# Patient Record
Sex: Female | Born: 1990 | Race: White | Hispanic: No | Marital: Single | Smoking: Never smoker
Health system: Southern US, Community
[De-identification: ages and names within clinical notes are randomized; demographics above are authoritative.]

## PROBLEM LIST (undated history)

## (undated) HISTORY — PX: TONSILECTOMY/ADENOIDECTOMY WITH MYRINGOTOMY: SHX6125

---
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
## 2013-07-11 ENCOUNTER — Ambulatory Visit (INDEPENDENT_AMBULATORY_CARE_PROVIDER_SITE_OTHER): Payer: Medicaid Other | Admitting: General Practice

## 2013-07-11 ENCOUNTER — Encounter (INDEPENDENT_AMBULATORY_CARE_PROVIDER_SITE_OTHER): Payer: Self-pay

## 2013-07-11 ENCOUNTER — Encounter: Payer: Self-pay | Admitting: General Practice

## 2013-07-11 VITALS — BP 112/66 | HR 73 | Temp 97.7°F | Ht 66.0 in | Wt 161.0 lb

## 2013-07-11 DIAGNOSIS — N390 Urinary tract infection, site not specified: Secondary | ICD-10-CM

## 2013-07-11 DIAGNOSIS — R350 Frequency of micturition: Secondary | ICD-10-CM

## 2013-07-11 LAB — POCT URINALYSIS DIPSTICK
Ketones, UA: NEGATIVE
Nitrite, UA: NEGATIVE

## 2013-07-11 MED ORDER — PHENAZOPYRIDINE HCL 200 MG PO TABS: 200.0000 mg | ORAL_TABLET | Freq: Three times a day (TID) | ORAL | Status: DC | PRN

## 2013-07-11 MED ORDER — CIPROFLOXACIN HCL 500 MG PO TABS: 500.0000 mg | ORAL_TABLET | Freq: Two times a day (BID) | ORAL | Status: DC

## 2013-07-11 NOTE — Progress Notes (Signed)
  Subjective:    Patient ID: Sherry Bryant, female    DOB: 08-07-1991, 22 y.o.   MRN: [id number]  Urinary Tract Infection  This is a new problem. The current episode started in the past 7 days. The problem occurs every urination. The problem has been gradually worsening. The quality of the pain is described as aching. The pain is at a severity of 7/10. There has been no fever. She is sexually active. There is no history of pyelonephritis. Associated symptoms include flank pain, frequency and urgency. Pertinent negatives include no chills, hematuria, possible pregnancy or vomiting. She has tried nothing for the symptoms. Her past medical history is significant for recurrent UTIs. There is no history of kidney stones, a single kidney or a urological procedure.  Reports last reports last menstrual cycle as Oct. 20, 2014 and normal cycle.     Review of Systems  Constitutional: Negative for fever and chills.  Respiratory: Negative for chest tightness and shortness of breath.   Cardiovascular: Negative for chest pain and palpitations.  Gastrointestinal: Negative for vomiting.  Genitourinary: Positive for urgency, frequency and flank pain. Negative for hematuria.  Neurological: Negative for dizziness, weakness and headaches.       Objective:   Physical Exam  Constitutional: She is oriented to person, place, and time. She appears well-developed and well-nourished.  Cardiovascular: Normal rate, regular rhythm and normal heart sounds.   Pulmonary/Chest: Effort normal and breath sounds normal.  Abdominal: Soft. Bowel sounds are normal. She exhibits no distension. There is tenderness in the suprapubic area. There is no CVA tenderness.  Neurological: She is alert and oriented to person, place, and time.  Skin: Skin is warm and dry.  Psychiatric: She has a normal mood and affect.    Results for orders placed in visit on 07/11/13  POCT URINALYSIS DIPSTICK      Result Value Range   Color, UA amber      Clarity, UA cloudy     Glucose, UA negative     Bilirubin, UA negative     Ketones, UA negative     Spec Grav, UA 1.015     Blood, UA large     pH, UA 7.0     Protein, UA +++     Urobilinogen, UA negative     Nitrite, UA negative     Leukocytes, UA large (3+)           Assessment & Plan:  1. Frequent urination  - POCT urinalysis dipstick - Urine culture  2. UTI (urinary tract infection)  - ciprofloxacin (CIPRO) 500 MG tablet; Take 1 tablet (500 mg total) by mouth 2 (two) times daily.  Dispense: 20 tablet; Refill: 0 - phenazopyridine (PYRIDIUM) 200 MG tablet; Take 1 tablet (200 mg total) by mouth 3 (three) times daily as needed for pain.  Dispense: 6 tablet; Refill: 0 -Increase fluid intake AZO over the counter X2 days Frequent voiding Proper perineal hygiene RTO prn Culture pending Patient verbalized understanding Coralie Keens, FNP-C

## 2013-07-11 NOTE — Patient Instructions (Signed)
Urinary Tract Infection  Urinary tract infections (UTIs) can develop anywhere along your urinary tract. Your urinary tract is your body's drainage system for removing wastes and extra water. Your urinary tract includes two kidneys, two ureters, a bladder, and a urethra. Your kidneys are a pair of bean-shaped organs. Each kidney is about the size of your fist. They are located below your ribs, one on each side of your spine.  CAUSES  Infections are caused by microbes, which are microscopic organisms, including fungi, viruses, and bacteria. These organisms are so small that they can only be seen through a microscope. Bacteria are the microbes that most commonly cause UTIs.  SYMPTOMS   Symptoms of UTIs may vary by age and gender of the patient and by the location of the infection. Symptoms in young women typically include a frequent and intense urge to urinate and a painful, burning feeling in the bladder or urethra during urination. Older women and men are more likely to be tired, shaky, and weak and have muscle aches and abdominal pain. A fever may mean the infection is in your kidneys. Other symptoms of a kidney infection include pain in your back or sides below the ribs, nausea, and vomiting.  DIAGNOSIS  To diagnose a UTI, your caregiver will ask you about your symptoms. Your caregiver also will ask to provide a urine sample. The urine sample will be tested for bacteria and white blood cells. White blood cells are made by your body to help fight infection.  TREATMENT   Typically, UTIs can be treated with medication. Because most UTIs are caused by a bacterial infection, they usually can be treated with the use of antibiotics. The choice of antibiotic and length of treatment depend on your symptoms and the type of bacteria causing your infection.  HOME CARE INSTRUCTIONS   If you were prescribed antibiotics, take them exactly as your caregiver instructs you. Finish the medication even if you feel better after you  have only taken some of the medication.   Drink enough water and fluids to keep your urine clear or pale yellow.   Avoid caffeine, tea, and carbonated beverages. They tend to irritate your bladder.   Empty your bladder often. Avoid holding urine for long periods of time.   Empty your bladder before and after sexual intercourse.   After a bowel movement, women should cleanse from front to back. Use each tissue only once.  SEEK MEDICAL CARE IF:    You have back pain.   You develop a fever.   Your symptoms do not begin to resolve within 3 days.  SEEK IMMEDIATE MEDICAL CARE IF:    You have severe back pain or lower abdominal pain.   You develop chills.   You have nausea or vomiting.   You have continued burning or discomfort with urination.  MAKE SURE YOU:    Understand these instructions.   Will watch your condition.   Will get help right away if you are not doing well or get worse.  Document Released: 05/23/2005 Document Revised: 02/12/2012 Document Reviewed: 09/21/2011  [employer] Patient Information 2014 [employer].

## 2013-07-16 LAB — URINE CULTURE

## 2013-07-17 ENCOUNTER — Other Ambulatory Visit: Payer: Self-pay | Admitting: General Practice

## 2014-01-29 ENCOUNTER — Encounter: Payer: Self-pay | Admitting: Family

## 2014-01-29 ENCOUNTER — Ambulatory Visit (INDEPENDENT_AMBULATORY_CARE_PROVIDER_SITE_OTHER): Payer: PRIVATE HEALTH INSURANCE | Admitting: Family

## 2014-01-29 VITALS — BP 123/72 | HR 87 | Temp 100.3°F | Ht 66.0 in | Wt 167.2 lb

## 2014-01-29 DIAGNOSIS — N926 Irregular menstruation, unspecified: Secondary | ICD-10-CM

## 2014-01-29 LAB — POCT URINE PREGNANCY: Preg Test, Ur: NEGATIVE

## 2014-01-29 NOTE — Patient Instructions (Signed)

## 2014-01-29 NOTE — Progress Notes (Signed)
   Subjective:    Patient ID: Sherry Bryant, female    DOB: [birth year], 23 y.o.   MRN: [id number]  Possible Pregnancy   Pt presents to office for a pregnancy test. States she has been on Depo-Provera for the last year and states she bleeds everyday. However, the last three weeks she has not had any bleeding. Pt states she has taken two home pregnancy tests that were negative. Pt states she has a few abdominal cramps. Pt is sexual active.    Review of Systems  HENT: Negative.   Respiratory: Negative.   Cardiovascular: Negative.   Genitourinary: Negative.   Musculoskeletal: Negative.   Hematological: Negative.   All other systems reviewed and are negative.      Objective:   Physical Exam  Vitals reviewed. Constitutional: She is oriented to person, place, and time. She appears well-developed and well-nourished. No distress.  Cardiovascular: Normal rate, regular rhythm, normal heart sounds and intact distal pulses.   No murmur heard. Pulmonary/Chest: Effort normal and breath sounds normal. No respiratory distress. She has no wheezes.  Abdominal: Soft. Bowel sounds are normal. She exhibits no distension. There is no tenderness.  Musculoskeletal: Normal range of motion. She exhibits no edema and no tenderness.  Neurological: She is alert and oriented to person, place, and time.  Skin: Skin is warm and dry.  Psychiatric: She has a normal mood and affect. Her behavior is normal. Judgment and thought content normal.     BP 123/72  Pulse 87  Temp(Src) 100.3 F (37.9 C) (Oral)  Ht 5\' 6"  (1.676 m)  Wt 167 lb 3.2 oz (75.841 kg)  BMI 27.00 kg/m2  LMP 01/07/2014  Results for orders placed in visit on 01/29/14  POCT URINE PREGNANCY      Result Value Ref Range   Preg Test, Ur Negative         Assessment & Plan:  1. Missed period -Continue Depo-Provera every three months -Safe sex discussed - POCT urine pregnancy  Jannifer Rodney, FNP

## 2014-02-12 ENCOUNTER — Ambulatory Visit (INDEPENDENT_AMBULATORY_CARE_PROVIDER_SITE_OTHER): Payer: PRIVATE HEALTH INSURANCE | Admitting: Nurse Practitioner

## 2014-02-12 ENCOUNTER — Encounter: Payer: Self-pay | Admitting: Nurse Practitioner

## 2014-02-12 VITALS — BP 168/76 | HR 76 | Temp 98.7°F | Ht 66.0 in | Wt 168.6 lb

## 2014-02-12 DIAGNOSIS — N390 Urinary tract infection, site not specified: Secondary | ICD-10-CM

## 2014-02-12 DIAGNOSIS — R300 Dysuria: Secondary | ICD-10-CM

## 2014-02-12 LAB — POCT URINALYSIS DIPSTICK
BILIRUBIN UA: NEGATIVE
GLUCOSE UA: NEGATIVE
KETONES UA: NEGATIVE
Nitrite, UA: POSITIVE
Spec Grav, UA: 1.025
UROBILINOGEN UA: NEGATIVE
pH, UA: 7

## 2014-02-12 LAB — POCT UA - MICROSCOPIC ONLY
CRYSTALS, UR, HPF, POC: NEGATIVE
Casts, Ur, LPF, POC: NEGATIVE
YEAST UA: NEGATIVE

## 2014-02-12 MED ORDER — CIPROFLOXACIN HCL 500 MG PO TABS: 500.0000 mg | ORAL_TABLET | Freq: Two times a day (BID) | ORAL | Status: DC

## 2014-02-12 NOTE — Progress Notes (Signed)
   Subjective:    Patient ID: Sherry Bryant, female    DOB: [birth year], 23 y.o.   MRN: [id number]  HPI Patient in c/o dysuria that started over a week ago- says that she gets UTI frequently. Wants referral to urology.    Review of Systems  Constitutional: Negative.   HENT: Negative.   Respiratory: Negative.   Cardiovascular: Negative.   Genitourinary: Positive for dysuria, urgency and frequency.  Neurological: Negative.   Psychiatric/Behavioral: Negative.   All other systems reviewed and are negative.      Objective:   Physical Exam  Constitutional: She is oriented to person, place, and time. She appears well-developed and well-nourished.  Cardiovascular: Normal rate, regular rhythm and normal heart sounds.   Pulmonary/Chest: Effort normal and breath sounds normal.  Abdominal: Soft. Bowel sounds are normal. She exhibits no distension. There is tenderness (suprapubic pain on palpation).  Genitourinary:  Left CVA tenderness  Neurological: She is alert and oriented to person, place, and time.  Skin: Skin is warm and dry.  Psychiatric: She has a normal mood and affect. Her behavior is normal. Judgment and thought content normal.   BP 168/76  Pulse 76  Temp(Src) 98.7 F (37.1 C) (Oral)  Ht 5\' 6"  (1.676 m)  Wt 168 lb 9.6 oz (76.476 kg)  BMI 27.23 kg/m2  LMP 01/07/2014  Results for orders placed in visit on 02/12/14  POCT URINALYSIS DIPSTICK      Result Value Ref Range   Color, UA straw     Clarity, UA cloudy     Glucose, UA neg     Bilirubin, UA neg     Ketones, UA neg     Spec Grav, UA 1.025     Blood, UA large     pH, UA 7.0     Protein, UA 3+++     Urobilinogen, UA negative     Nitrite, UA pos     Leukocytes, UA large (3+)    POCT UA - MICROSCOPIC ONLY      Result Value Ref Range   WBC, Ur, HPF, POC TNTC     RBC, urine, microscopic TNTC     Bacteria, U Microscopic MANY     Mucus, UA MOD     Epithelial cells, urine per micros OCC     Crystals, Ur, HPF, POC  NEG     Casts, Ur, LPF, POC NEG     Yeast, UA NEG           Assessment & Plan:   1. Dysuria   2. Urinary tract infection without hematuria, site unspecified    Meds ordered this encounter  Medications  . ciprofloxacin (CIPRO) 500 MG tablet    Sig: Take 1 tablet (500 mg total) by mouth 2 (two) times daily.    Dispense:  20 tablet    Refill:  0    Order Specific Question:  Supervising Natallie Ravenscroft    Answer:  Ernestina PennaMOORE, DONALD W [[id number]]   Orders Placed This Encounter  Procedures  . Ambulatory referral to Urology    Referral Priority:  Routine    Referral Type:  Consultation    Referral Reason:  Specialty Services Required    Requested Specialty:  Urology    Number of Visits Requested:  1  . POCT urinalysis dipstick  . POCT UA - Microscopic Only   Force fluids AZO over the counter X2 days RTO prn Culture pending  Mary-Margaret Daphine DeutscherMartin, FNP

## 2014-02-12 NOTE — Patient Instructions (Signed)

## 2014-02-14 LAB — URINE CULTURE

## 2014-02-16 ENCOUNTER — Telehealth: Payer: Self-pay | Admitting: Family Medicine

## 2014-02-16 NOTE — Telephone Encounter (Signed)
Message copied by Azalee CourseFULP, ASHLEY on Tue Feb 16, 2014 10:25 AM ------      Message from: Bennie PieriniMARTIN, MARY-MARGARET      Created: Mon Feb 15, 2014  2:02 PM       Patient given cipro good coverage ------

## 2014-03-04 ENCOUNTER — Telehealth: Payer: Self-pay | Admitting: Nurse Practitioner

## 2014-03-04 NOTE — Telephone Encounter (Signed)
appt given with Sherry Bryant tomorrow

## 2014-03-05 ENCOUNTER — Encounter: Payer: Self-pay | Admitting: Physician Assistant

## 2014-03-05 ENCOUNTER — Ambulatory Visit (INDEPENDENT_AMBULATORY_CARE_PROVIDER_SITE_OTHER): Payer: PRIVATE HEALTH INSURANCE | Admitting: Physician Assistant

## 2014-03-05 VITALS — BP 126/77 | HR 102 | Temp 98.1°F | Ht 66.0 in | Wt 167.2 lb

## 2014-03-05 DIAGNOSIS — F411 Generalized anxiety disorder: Secondary | ICD-10-CM

## 2014-03-05 DIAGNOSIS — R454 Irritability and anger: Secondary | ICD-10-CM

## 2014-03-05 MED ORDER — SERTRALINE HCL 50 MG PO TABS: 50.0000 mg | ORAL_TABLET | Freq: Every day | ORAL | Status: DC

## 2014-03-06 LAB — CBC WITH DIFFERENTIAL
Basophils Absolute: 0 10*3/uL (ref 0.0–0.2)
Basos: 0 %
EOS ABS: 0.1 10*3/uL (ref 0.0–0.4)
Eos: 1 %
HEMATOCRIT: 40.7 % (ref 34.0–46.6)
HEMOGLOBIN: 13.1 g/dL (ref 11.1–15.9)
IMMATURE GRANS (ABS): 0 10*3/uL (ref 0.0–0.1)
IMMATURE GRANULOCYTES: 0 %
LYMPHS: 24 %
Lymphocytes Absolute: 1.7 10*3/uL (ref 0.7–3.1)
MCH: 27 pg (ref 26.6–33.0)
MCHC: 32.2 g/dL (ref 31.5–35.7)
MCV: 84 fL (ref 79–97)
Monocytes Absolute: 0.6 10*3/uL (ref 0.1–0.9)
Monocytes: 8 %
NEUTROS ABS: 4.9 10*3/uL (ref 1.4–7.0)
NEUTROS PCT: 67 %
PLATELETS: 254 10*3/uL (ref 150–379)
RBC: 4.85 x10E6/uL (ref 3.77–5.28)
RDW: 14.6 % (ref 12.3–15.4)
WBC: 7.4 10*3/uL (ref 3.4–10.8)

## 2014-03-06 LAB — BMP8+EGFR
BUN/Creatinine Ratio: 10 (ref 8–20)
BUN: 8 mg/dL (ref 6–20)
CALCIUM: 9.6 mg/dL (ref 8.7–10.2)
CHLORIDE: 101 mmol/L (ref 97–108)
CO2: 23 mmol/L (ref 18–29)
CREATININE: 0.81 mg/dL (ref 0.57–1.00)
GFR calc non Af Amer: 103 mL/min/{1.73_m2} (ref >59)
GFR, EST AFRICAN AMERICAN: 119 mL/min/{1.73_m2} (ref >59)
Glucose: 85 mg/dL (ref 65–99)
POTASSIUM: 4.3 mmol/L (ref 3.5–5.2)
SODIUM: 139 mmol/L (ref 134–144)

## 2014-03-06 LAB — TSH: TSH: 2.98 u[IU]/mL (ref 0.450–4.500)

## 2014-03-06 LAB — T4, FREE: FREE T4: 1.21 ng/dL (ref 0.82–1.77)

## 2014-03-06 LAB — T3, FREE: T3 FREE: 3.7 pg/mL (ref 2.0–4.4)

## 2014-03-14 NOTE — Progress Notes (Signed)
Subjective:     Patient ID: Sherry Bryant, female   DOB: December 14, 1990, [age] y.o.   MRN: [id number]  HPI 23 y/o female presents for increased irritability, anger x several months. She has noticed this along with several of her family members. She has a small child at home and is in the process of going back to college. Denies any change in sleep pattern or increased stress but has had increased fatigue.     Review of Systems  Constitutional: Positive for fatigue. Negative for fever, chills, diaphoresis, activity change, appetite change and unexpected weight change.  HENT: Negative.   Eyes: Negative.   Respiratory: Negative.   Cardiovascular: Negative.   Gastrointestinal: Negative.   Endocrine: Negative.   Genitourinary: Negative.   Allergic/Immunologic: Negative.   Neurological: Negative.   Hematological: Negative.   Psychiatric/Behavioral: Positive for agitation. Negative for suicidal ideas, hallucinations, behavioral problems, confusion, sleep disturbance, self-injury, dysphoric mood and decreased concentration. The patient is nervous/anxious. The patient is not hyperactive.        Angers easily       Objective:   Physical Exam  Nursing note and vitals reviewed. Constitutional: She is oriented to person, place, and time. She appears well-developed and well-nourished. No distress.  HENT:  Head: Normocephalic.  Right Ear: External ear normal.  Left Ear: External ear normal.  Mouth/Throat: No oropharyngeal exudate.  Eyes: Pupils are equal, round, and reactive to light. Right eye exhibits no discharge. Left eye exhibits no discharge.  Neck: Normal range of motion. No tracheal deviation present. No thyromegaly present.  Cardiovascular: Normal rate and regular rhythm.  Exam reveals friction rub. Exam reveals no gallop.   No murmur heard. Pulmonary/Chest: Effort normal and breath sounds normal. No respiratory distress. She has no wheezes.  Abdominal: Soft. There is no tenderness.   Lymphadenopathy:    She has no cervical adenopathy.  Neurological: She is alert and oriented to person, place, and time. She has normal reflexes.  Skin: She is not diaphoretic.  Psychiatric: She has a normal mood and affect. Her behavior is normal. Judgment and thought content normal.       Assessment:    1. Generalized Anxiety Disorder    Plan:    Prescribed Zoloft 50mg  q day for anxiety. Suggested she take the medication at bedtime if drowsiness occurs and advised her that it will need 4-6 weeks to take effect. She will f/u in 4 weeks for reassessment and possible dose increase. She was also given a list of psychiatric providers in the area that I encouraged her to seek for counseling.

## 2014-04-02 ENCOUNTER — Encounter: Payer: Self-pay | Admitting: Physician Assistant

## 2014-04-02 ENCOUNTER — Ambulatory Visit (INDEPENDENT_AMBULATORY_CARE_PROVIDER_SITE_OTHER): Payer: PRIVATE HEALTH INSURANCE | Admitting: Physician Assistant

## 2014-04-02 VITALS — BP 115/68 | HR 83 | Temp 99.1°F | Ht 66.0 in | Wt 167.4 lb

## 2014-04-02 DIAGNOSIS — R300 Dysuria: Secondary | ICD-10-CM

## 2014-04-02 DIAGNOSIS — F411 Generalized anxiety disorder: Secondary | ICD-10-CM

## 2014-04-02 DIAGNOSIS — R454 Irritability and anger: Secondary | ICD-10-CM

## 2014-04-02 LAB — POCT URINALYSIS DIPSTICK
BILIRUBIN UA: NEGATIVE
Glucose, UA: NEGATIVE
Ketones, UA: NEGATIVE
Leukocytes, UA: NEGATIVE
Nitrite, UA: NEGATIVE
Spec Grav, UA: >=1.03
UROBILINOGEN UA: NEGATIVE
pH, UA: 6.5

## 2014-04-02 MED ORDER — SERTRALINE HCL 100 MG PO TABS: 100.0000 mg | ORAL_TABLET | Freq: Every day | ORAL | Status: DC

## 2014-04-02 NOTE — Progress Notes (Signed)
   Subjective:    Patient ID: Sherry Bryant, female    DOB: Aug 09, 1991, 23 y.o.   MRN: [id number]  HPI 23 y/o female presents for f/u of anxiety, irritability with recent pharmacological treatment of Zoloft. She feels that she has had less anxiety but wishes to increase dosage as we had discussed at her last appointment. She also has noticed that since her last Depomedrol injection and addition of Zoloft that she has had vaginal bleeding on a regular basis plus burning with urination.    Review of Systems  Constitutional: Negative.   HENT: Negative.   Eyes: Negative.   Respiratory: Negative.   Cardiovascular: Negative.   Gastrointestinal: Negative.   Endocrine: Negative.   Genitourinary: Positive for dysuria and vaginal bleeding. Negative for urgency, frequency, hematuria, flank pain, vaginal discharge, enuresis, genital sores and pelvic pain.  Allergic/Immunologic: Negative.   Neurological: Negative.   Psychiatric/Behavioral: Positive for agitation. Negative for suicidal ideas, hallucinations, behavioral problems, confusion, sleep disturbance, self-injury, dysphoric mood and decreased concentration. The patient is nervous/anxious. The patient is not hyperactive.        Objective:   Physical Exam  Nursing note and vitals reviewed. Constitutional: She is oriented to person, place, and time. She appears well-developed and well-nourished. No distress.  HENT:  Head: Normocephalic and atraumatic.  Right Ear: External ear normal.  Left Ear: External ear normal.  Neck: No thyromegaly present.  Cardiovascular: Normal rate, regular rhythm, normal heart sounds and intact distal pulses.  Exam reveals no gallop and no friction rub.   No murmur heard. Pulmonary/Chest: Effort normal and breath sounds normal. No respiratory distress. She has no wheezes. She has no rales. She exhibits no tenderness.  Abdominal: Soft. Bowel sounds are normal. She exhibits no distension and no mass. There is no  tenderness. There is no rebound and no guarding.  Neurological: She is alert and oriented to person, place, and time.  Skin: She is not diaphoretic.  Psychiatric: She has a normal mood and affect. Her behavior is normal. Judgment and thought content normal.          Assessment & Plan:  1. Anxiety: Increase Zoloft 50mg  q day to 100mg  q day. F/U for reassessment in 3 months.  2. Dysuria: U/A negative for WBC. No UTI suspected today 3. Dysfunctional Uterine Bleeding: Advised patient to f[place].

## 2014-06-11 ENCOUNTER — Ambulatory Visit (INDEPENDENT_AMBULATORY_CARE_PROVIDER_SITE_OTHER): Payer: PRIVATE HEALTH INSURANCE | Admitting: Family Medicine

## 2014-06-11 ENCOUNTER — Encounter: Payer: Self-pay | Admitting: Family Medicine

## 2014-06-11 VITALS — BP 114/75 | HR 91 | Temp 98.4°F | Ht 66.0 in | Wt 171.0 lb

## 2014-06-11 DIAGNOSIS — F329 Major depressive disorder, single episode, unspecified: Secondary | ICD-10-CM

## 2014-06-11 DIAGNOSIS — F32A Depression, unspecified: Secondary | ICD-10-CM

## 2014-06-11 NOTE — Progress Notes (Signed)
   Subjective:    Patient ID: Sherry Bryant, female    DOB: 1990/09/02, 23 y.o.   MRN: [id number]  HPI C/o depression and mood sx's not controlled with the sertraline.  She is having racing thoughts and she is having a lot of insomnia. She states she cannot sleep at night because her thoughts are racing. She c/o having some compulsion problems.  She is having relationship problems.  She denies suicidal or homicidal ideation.  She is having some problems with low energy.  She has excessive guilt and low energy.  She has problems with anger and rage.  She states she has hx of ADD and her attention is a problem.   Review of Systems    No chest pain, SOB, HA, dizziness, vision change, N/V, diarrhea, constipation, dysuria, urinary urgency or frequency, myalgias, arthralgias or rash.  Objective:   Physical Exam Vital signs noted  Well developed well nourished female.  HEENT - Head atraumatic Normocephalic                Eyes - PERRLA, Conjuctiva - clear Sclera- Clear EOMI                Ears - EAC's Wnl TM's Wnl Gross Hearing WNL                Nose - Nares patent                 Throat - oropharanx wnl Respiratory - Lungs CTA bilateral Cardiac - RRR S1 and S2 without murmur GI - Abdomen soft Nontender and bowel sounds active x 4 Extremities - No edema. Neuro - Grossly intact.        Assessment & Plan:  Depression Discussed with patient that she may have ADD and bipolar disorder.  Recommend she see psychiatry for evaluation and treatment as with dual dx's this is better option instead of having to try several meds. Discussed with her that the psychiatrist can do better at dx and treating her specific psychiatric condition and she agrees.  A list of psychiatric providers is given and recommend she see them ASAP.  Deatra CanterWilliam J Colbe Viviano FNP

## 2014-09-21 ENCOUNTER — Encounter (INDEPENDENT_AMBULATORY_CARE_PROVIDER_SITE_OTHER): Payer: Self-pay

## 2014-09-21 ENCOUNTER — Encounter: Payer: Self-pay | Admitting: Nurse Practitioner

## 2014-09-21 ENCOUNTER — Ambulatory Visit (INDEPENDENT_AMBULATORY_CARE_PROVIDER_SITE_OTHER): Payer: PRIVATE HEALTH INSURANCE | Admitting: Nurse Practitioner

## 2014-09-21 VITALS — BP 131/87 | HR 86 | Temp 97.7°F | Ht 66.0 in | Wt 171.0 lb

## 2014-09-21 DIAGNOSIS — L989 Disorder of the skin and subcutaneous tissue, unspecified: Secondary | ICD-10-CM

## 2014-09-21 DIAGNOSIS — L293 Anogenital pruritus, unspecified: Secondary | ICD-10-CM

## 2014-09-21 MED ORDER — SILVER SULFADIAZINE 1 % EX CREA: 1.0000 "application " | TOPICAL_CREAM | Freq: Every day | CUTANEOUS | Status: DC

## 2014-09-21 NOTE — Progress Notes (Signed)
   Subjective:    Patient ID: Sherry Bryant, female    DOB: 11-11-1990, 24 y.o.   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 She used for a week and saw no difference so she stopped. Then started getting more lesions so started using cream again about 1 week ago. Used everyday instead of 3-4 x a week. Now her perineal area and vaginal area are erythematous swollen and sore to the touch.   Review of Systems  Constitutional: Positive for appetite change.  HENT: Negative.   Respiratory: Negative.   Cardiovascular: Negative.   Gastrointestinal: Negative.   Genitourinary: Negative.   Neurological: Negative.   Psychiatric/Behavioral: Negative.   All other systems reviewed and are negative.      Objective:   Physical Exam  Constitutional: She is oriented to person, place, and time. She appears well-developed and well-nourished.  Cardiovascular: Normal rate, regular rhythm and normal heart sounds.   Pulmonary/Chest: Effort normal and breath sounds normal.  Genitourinary:  Perineum erythematous and thik appearing with vesicular lesion noted on bil labia minora.  Neurological: She is alert and oriented to person, place, and time.  Skin: Skin is warm and dry.  Psychiatric: She has a normal mood and affect. Her behavior is normal. Judgment and thought content normal.   BP 131/87 mmHg  Pulse 86  Temp(Src) 97.7 F (36.5 C) (Oral)  Ht 5\' 6"  (1.676 m)  Wt 171 lb (77.565 kg)  BMI 27.61 kg/m2        Assessment & Plan:  1. Perineal irritation/ burn secondary to over use of aldara cream Wear pad with silvadene cream reaply after voiding RTO if not imroving - silver sulfADIAZINE (SILVADENE) 1 % cream; Apply 1 application topically daily.  Dispense: 400 g; Refill: 0  Mary-Margaret Daphine DeutscherMartin, FNP

## 2014-09-22 ENCOUNTER — Telehealth: Payer: Self-pay | Admitting: Nurse Practitioner

## 2014-09-22 MED ORDER — TRAMADOL HCL 50 MG PO TABS: 50.0000 mg | ORAL_TABLET | Freq: Three times a day (TID) | ORAL | Status: DC | PRN

## 2014-09-22 NOTE — Telephone Encounter (Signed)
Please review and advise.

## 2014-09-22 NOTE — Telephone Encounter (Signed)
Patient states that she was seen on 1/26 with you and she is taking Ibuprofen and Tylenol and it is not helping. She says she is suppose to go back to work tomorrow and is not sure that she will be able to work she has a perineal irritation/burn

## 2014-09-22 NOTE — Telephone Encounter (Signed)
Pt notified RX is ready for pick up 

## 2014-09-22 NOTE — Telephone Encounter (Signed)
Ultram rx ready for pick up  

## 2014-09-22 NOTE — Telephone Encounter (Signed)
NTBS for pain meds 

## 2014-11-22 ENCOUNTER — Encounter: Payer: Self-pay | Admitting: *Deleted

## 2014-11-22 ENCOUNTER — Encounter: Payer: Self-pay | Admitting: Family Medicine

## 2014-11-22 ENCOUNTER — Ambulatory Visit (INDEPENDENT_AMBULATORY_CARE_PROVIDER_SITE_OTHER): Payer: PRIVATE HEALTH INSURANCE | Admitting: Family Medicine

## 2014-11-22 VITALS — BP 116/71 | HR 89 | Temp 98.5°F | Ht 66.0 in | Wt 174.0 lb

## 2014-11-22 DIAGNOSIS — R05 Cough: Secondary | ICD-10-CM | POA: Diagnosis not present

## 2014-11-22 DIAGNOSIS — A084 Viral intestinal infection, unspecified: Secondary | ICD-10-CM | POA: Diagnosis not present

## 2014-11-22 DIAGNOSIS — J029 Acute pharyngitis, unspecified: Secondary | ICD-10-CM | POA: Diagnosis not present

## 2014-11-22 DIAGNOSIS — J301 Allergic rhinitis due to pollen: Secondary | ICD-10-CM

## 2014-11-22 DIAGNOSIS — R197 Diarrhea, unspecified: Secondary | ICD-10-CM | POA: Diagnosis not present

## 2014-11-22 DIAGNOSIS — R059 Cough, unspecified: Secondary | ICD-10-CM

## 2014-11-22 LAB — POCT RAPID STREP A (OFFICE): RAPID STREP A SCREEN: NEGATIVE

## 2014-11-22 LAB — POCT INFLUENZA A/B
INFLUENZA A, POC: NEGATIVE
INFLUENZA B, POC: NEGATIVE

## 2014-11-22 NOTE — Progress Notes (Signed)
Subjective:    Patient ID: Sherry Bryant, female    DOB: 06-Jan-1991, 24 y.o.   MRN: [id number]  HPI Patient here today for sore throat, diarrhea, cough, and runny nose. The patient notes that all this has been going on for a couple of days. She's had about 4 watery stools daily for the past couple of days. She says her daughter was sick with the same loose bowel movement but it only lasted for a day with her. She's had minimal cough.        There are no active problems to display for this patient.  Outpatient Encounter Prescriptions as of 11/22/2014  Medication Sig  . medroxyPROGESTERone (DEPO-PROVERA) 150 MG/ML injection Inject 150 mg into the muscle every 3 (three) months.  . [DISCONTINUED] silver sulfADIAZINE (SILVADENE) 1 % cream Apply 1 application topically daily.  . [DISCONTINUED] traMADol (ULTRAM) 50 MG tablet Take 1 tablet (50 mg total) by mouth every 8 (eight) hours as needed.    Review of Systems  Constitutional: Negative.   HENT: Positive for postnasal drip, sinus pressure and sneezing.   Eyes: Negative.   Respiratory: Positive for cough.   Cardiovascular: Negative.   Gastrointestinal: Positive for diarrhea.  Endocrine: Negative.   Genitourinary: Negative.   Musculoskeletal: Negative.   Skin: Negative.   Allergic/Immunologic: Negative.   Neurological: Negative.   Hematological: Negative.   Psychiatric/Behavioral: Negative.        Objective:   Physical Exam  Constitutional: She is oriented to person, place, and time. She appears well-developed and well-nourished.  HENT:  Head: Normocephalic and atraumatic.  Right Ear: External ear normal.  Left Ear: External ear normal.  Mouth/Throat: Oropharynx is clear and moist.  There was no redness in the throat. There Is nasal congestion bilaterally right greater than left  Eyes: Conjunctivae and EOM are normal. Pupils are equal, round, and reactive to light. Right eye exhibits no discharge. Left eye exhibits no  discharge. No scleral icterus.  Neck: Normal range of motion. Neck supple. No JVD present. No thyromegaly present.  There is some small anterior cervical adenopathy bilaterally  Cardiovascular: Normal rate, regular rhythm and normal heart sounds.   No murmur heard. Pulmonary/Chest: Effort normal and breath sounds normal. No respiratory distress. She has no wheezes. She has no rales. She exhibits no tenderness.  Abdominal: Soft. Bowel sounds are normal. She exhibits no mass. There is tenderness. There is no rebound and no guarding.  There is tenderness in the. Umbilical and suprapubic areas.  Musculoskeletal: Normal range of motion. She exhibits no edema.  Lymphadenopathy:    She has cervical adenopathy.  Neurological: She is alert and oriented to person, place, and time.  Skin: Skin is warm and dry. No rash noted.  Psychiatric: She has a normal mood and affect. Her behavior is normal. Judgment and thought content normal.  Nursing note and vitals reviewed.  BP 116/71 mmHg  Pulse 89  Temp(Src) 98.5 F (36.9 C) (Oral)  Ht  (1.676 m)  Wt 174 lb (78.926 kg)  BMI 28.10 kg/m2   The rapid strep and flu test were both negative      Assessment & Plan:  1. Sore throat -Rapid strep was negative and a throat culture is pending. If this is positive we will still need antibiotics but we will wait until the results are returned. - POCT Influenza A/B - POCT rapid strep A - Culture, Group A Strep  2. Diarrhea -Work was diet as directed - POCT Influenza  A/B - POCT rapid strep A - Culture, Group A Strep  3. Cough -Take Mucinex maximum strength over-the-counter 1 twice daily as needed for cough and congestion - POCT Influenza A/B - POCT rapid strep A - Culture, Group A Strep  4. Allergic rhinitis due to pollen -Use Claritin, Allegra, or Zyrtec for drainage -Use Flonase over-the-counter 1-2 sprays each nostril daily  5. Viral gastroenteritis -Work with diet as directed  Patient  Instructions  Clear liquids for 24 hours (like 7-Up, ginger ale, Sprite, Jello, frozen pops) Full liquids the second 24-hours (like potato soup, tomato soup, chicken noodle soup) Bland diet the third 24-hours (boiled and baked foods, no fried or greasy foods) Avoid milk, cheese, ice cream and dairy products for 72 hours. Avoid caffeine (cola drinks, coffee, tea, Mountain Dew, Mellow Yellow) Take in small amounts, but frequently. Tylenol and/or Advil as needed for aches pains and fever  Use Claritin, Allegra, or Zyrtec over-the-counter you for allergic rhinitis Flonase over-the-counter 1-2 sprays will also be helpful    Nyra Capeson W. Moore MD

## 2014-11-22 NOTE — Patient Instructions (Signed)
Clear liquids for 24 hours (like 7-Up, ginger ale, Sprite, Jello, frozen pops) Full liquids the second 24-hours (like potato soup, tomato soup, chicken noodle soup) Bland diet the third 24-hours (boiled and baked foods, no fried or greasy foods) Avoid milk, cheese, ice cream and dairy products for 72 hours. Avoid caffeine (cola drinks, coffee, tea, Mountain Dew, Mellow Yellow) Take in small amounts, but frequently. Tylenol and/or Advil as needed for aches pains and fever  Use Claritin, Allegra, or Zyrtec over-the-counter you for allergic rhinitis Flonase over-the-counter 1-2 sprays will also be helpful

## 2014-11-24 LAB — CULTURE, GROUP A STREP: Strep A Culture: NEGATIVE

## 2014-12-29 ENCOUNTER — Telehealth: Payer: Self-pay | Admitting: Nurse Practitioner

## 2014-12-29 NOTE — Telephone Encounter (Signed)
Patient advised to continue warm compresses and is aware that if it gets worse she will need to be seen.

## 2014-12-30 ENCOUNTER — Ambulatory Visit (INDEPENDENT_AMBULATORY_CARE_PROVIDER_SITE_OTHER): Payer: PRIVATE HEALTH INSURANCE | Admitting: Family Medicine

## 2014-12-30 ENCOUNTER — Encounter: Payer: Self-pay | Admitting: Family Medicine

## 2014-12-30 VITALS — BP 130/79 | HR 86 | Temp 98.3°F | Ht 66.0 in | Wt 176.0 lb

## 2014-12-30 DIAGNOSIS — H00016 Hordeolum externum left eye, unspecified eyelid: Secondary | ICD-10-CM

## 2014-12-30 DIAGNOSIS — R202 Paresthesia of skin: Secondary | ICD-10-CM | POA: Diagnosis not present

## 2014-12-30 DIAGNOSIS — H00019 Hordeolum externum unspecified eye, unspecified eyelid: Secondary | ICD-10-CM | POA: Insufficient documentation

## 2014-12-30 MED ORDER — AMOXICILLIN-POT CLAVULANATE 875-125 MG PO TABS: 1.0000 | ORAL_TABLET | Freq: Two times a day (BID) | ORAL | Status: DC

## 2014-12-30 NOTE — Patient Instructions (Signed)
Apply warm compresses several times daily. Finish all of the Augmentin, antibiotic. Over the eye with a dressing as needed. Some bleeding is to be expected and actually is beneficial. If it becomes excessive please contact the office. Just keep the area clean and dry.

## 2014-12-30 NOTE — Progress Notes (Signed)
Subjective:  Patient ID: Sherry Bryant, female    DOB: [birth year]  Age: 24 y.o. MRN: [id number]  CC: Stye   HPI Sherry Bryant presents for increasing numbness and pain on the left side of her face. She states that this started at the same time she noticed a bump at the lower lid. The lid became somewhat painful over the last 2-3 days. Increase specifically noted this morning. Patient denies any involvement of her vision. However the left side of her cheek has been having increasing tingling and numbness moving down the cheek onto the jowl line. History Sherry Bryant has no past medical history on file.   She has past surgical history that includes Tonsilectomy/adenoidectomy with myringotomy.   Her family history is not on file.She reports that she has never smoked. She does not have any smokeless tobacco history on file. She reports that she does not drink alcohol or use illicit drugs.  Current Outpatient Prescriptions on File Prior to Visit  Medication Sig Dispense Refill  . medroxyPROGESTERone (DEPO-PROVERA) 150 MG/ML injection Inject 150 mg into the muscle every 3 (three) months.     No current facility-administered medications on file prior to visit.    ROS Review of Systems  Constitutional: Negative for fever, chills, diaphoresis, appetite change and fatigue.  HENT: Negative for congestion, ear pain, hearing loss, postnasal drip, rhinorrhea, sore throat and trouble swallowing.   Eyes: Positive for pain.  Respiratory: Negative for cough, chest tightness and shortness of breath.   Cardiovascular: Negative for chest pain and palpitations.  Gastrointestinal: Negative for abdominal pain.  Musculoskeletal: Negative for arthralgias.  Skin: Negative for rash.    Objective:  BP 130/79 mmHg  Pulse 86  Temp(Src) 98.3 F (36.8 C) (Oral)  Ht 5\' 6"  (1.676 m)  Wt 176 lb (79.833 kg)  BMI 28.42 kg/m2  Physical Exam  Constitutional: She is oriented to person, place, and time. She  appears well-developed and well-nourished. No distress.  HENT:  Head: Normocephalic and atraumatic.  Right Ear: External ear normal.  Left Ear: External ear normal.  Nose: Nose normal.  Mouth/Throat: Oropharynx is clear and moist.  Eyes: EOM are normal. Pupils are equal, round, and reactive to light. Left eye exhibits hordeolum (3 mm x 1 mm along the margin of the left lower lid beginning at the medial epicanthus.). Right conjunctiva is not injected. Left conjunctiva is injected.    Neck: Normal range of motion. Neck supple.  Cardiovascular: Normal rate, regular rhythm and normal heart sounds.   No murmur heard. Pulmonary/Chest: Breath sounds normal. She is in respiratory distress.  Abdominal: Soft. Bowel sounds are normal.  Lymphadenopathy:    She has no cervical adenopathy.  Neurological: She is alert and oriented to person, place, and time. She has normal reflexes.  Skin: Skin is warm and dry.  Psychiatric: She has a normal mood and affect. Her behavior is normal. Judgment and thought content normal.    Assessment & Plan:   Sherry Bryant was seen today for stye.  Diagnoses and all orders for this visit:  Hordeolum externum (stye), left  Other orders -     amoxicillin-clavulanate (AUGMENTIN) 875-125 MG per tablet; Take 1 tablet by mouth 2 (two) times daily. Take all of this medication   I am having Sherry Bryant start on amoxicillin-clavulanate. I am also having her maintain her medroxyPROGESTERone.  Meds ordered this encounter  Medications  . amoxicillin-clavulanate (AUGMENTIN) 875-125 MG per tablet    Sig: Take 1 tablet by mouth  2 (two) times daily. Take all of this medication    Dispense:  20 tablet    Refill:  0   lesion was prepped in sterile fashion and an 11 blade used to lance the lesion this produced a discharge of approximately 2 mL of frankly purulent matter. The procedure was tolerated well. Wound was subsequently cleansed and dressing applied. Wound care  reviewed   Follow-up: Return if symptoms worsen or fail to improve.  Mechele ClaudeWarren Keven Osborn, M.D.

## 2015-03-02 ENCOUNTER — Other Ambulatory Visit: Payer: Self-pay | Admitting: Physician Assistant

## 2015-03-02 ENCOUNTER — Encounter: Payer: Self-pay | Admitting: Physician Assistant

## 2015-03-02 ENCOUNTER — Ambulatory Visit (INDEPENDENT_AMBULATORY_CARE_PROVIDER_SITE_OTHER): Payer: PRIVATE HEALTH INSURANCE | Admitting: Physician Assistant

## 2015-03-02 VITALS — BP 129/76 | HR 89 | Temp 97.1°F | Ht 66.0 in | Wt 174.0 lb

## 2015-03-02 DIAGNOSIS — R5381 Other malaise: Secondary | ICD-10-CM | POA: Diagnosis not present

## 2015-03-02 DIAGNOSIS — R52 Pain, unspecified: Secondary | ICD-10-CM

## 2015-03-02 DIAGNOSIS — N390 Urinary tract infection, site not specified: Secondary | ICD-10-CM

## 2015-03-02 DIAGNOSIS — J029 Acute pharyngitis, unspecified: Secondary | ICD-10-CM | POA: Diagnosis not present

## 2015-03-02 DIAGNOSIS — R5383 Other fatigue: Secondary | ICD-10-CM

## 2015-03-02 DIAGNOSIS — R82998 Other abnormal findings in urine: Secondary | ICD-10-CM

## 2015-03-02 LAB — POCT URINALYSIS DIPSTICK
BILIRUBIN UA: NEGATIVE
GLUCOSE UA: NEGATIVE
Ketones, UA: NEGATIVE
NITRITE UA: NEGATIVE
Spec Grav, UA: 1.01
UROBILINOGEN UA: NEGATIVE
pH, UA: 7

## 2015-03-02 LAB — POCT CBC
Granulocyte percent: 65.3 %G (ref 37–80)
HEMATOCRIT: 41 % (ref 37.7–47.9)
HEMOGLOBIN: 13.6 g/dL (ref 12.2–16.2)
Lymph, poc: 1.2 (ref 0.6–3.4)
MCH: 27.9 pg (ref 27–31.2)
MCHC: 33.3 g/dL (ref 31.8–35.4)
MCV: 83.8 fL (ref 80–97)
MPV: 9.9 fL (ref 0–99.8)
POC Granulocyte: 3.3 (ref 2–6.9)
POC LYMPH %: 23.4 % (ref 10–50)
Platelet Count, POC: 147 10*3/uL (ref 142–424)
RBC: 4.89 M/uL (ref 4.04–5.48)
RDW, POC: 13.4 %
WBC: 5 10*3/uL (ref 4.6–10.2)

## 2015-03-02 LAB — POCT UA - MICROSCOPIC ONLY
CASTS, UR, LPF, POC: NEGATIVE
CRYSTALS, UR, HPF, POC: NEGATIVE
Mucus, UA: NEGATIVE
Yeast, UA: NEGATIVE

## 2015-03-02 LAB — POCT RAPID STREP A (OFFICE): RAPID STREP A SCREEN: NEGATIVE

## 2015-03-02 LAB — POCT INFLUENZA A/B
INFLUENZA A, POC: NEGATIVE
Influenza B, POC: NEGATIVE

## 2015-03-02 MED ORDER — NITROFURANTOIN MONOHYD MACRO 100 MG PO CAPS: 100.0000 mg | ORAL_CAPSULE | Freq: Two times a day (BID) | ORAL | Status: DC

## 2015-03-02 NOTE — Progress Notes (Signed)
Subjective:    Patient ID: Sherry Bryant, female    DOB: 1990/10/23, 23 y.o.   MRN: [id number]  HPI 24 y/o female presents with c/o aches, pain, sore throat, headache x 1 day. She states that she felt really weak. 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 Denies recent tick bite     Review of Systems  Constitutional: Positive for fever (low grade ) and appetite change (decreased ). Negative for chills, diaphoresis and fatigue.  HENT: Positive for ear pain (bilateral ) and sore throat. Negative for postnasal drip, rhinorrhea and sneezing.   Respiratory: Negative for cough.   Cardiovascular: Negative.   Gastrointestinal: Positive for diarrhea. Negative for nausea and vomiting.  Genitourinary: Negative for urgency, frequency and difficulty urinating.  Musculoskeletal: Positive for myalgias (aching all over ).  Skin: Negative for rash.  Neurological: Positive for weakness, light-headedness (going from bending forward to standing upright position ) and headaches.       Objective:   Physical Exam  Constitutional: She appears well-developed and well-nourished. No distress.  HENT:  Head: Normocephalic.  Right Ear: External ear normal.  Left Ear: External ear normal.  Mouth/Throat: No oropharyngeal exudate.  Cardiovascular: Normal rate, regular rhythm and normal heart sounds.  Exam reveals no gallop and no friction rub.   No murmur heard. Pulmonary/Chest: Effort normal and breath sounds normal.  Abdominal: Soft. She exhibits no distension. There is no tenderness. There is no rebound and no guarding.  Skin: She is not diaphoretic.  Nursing note and vitals reviewed.  Results for orders placed or performed in visit on 03/02/15  POCT rapid strep A  Result Value Ref Range   Rapid Strep A Screen Negative Negative  POCT Influenza A/B  Result Value Ref Range   Influenza A, POC Negative Negative   Influenza B, POC Negative Negative  POCT urinalysis dipstick  Result  Value Ref Range   Color, UA gold    Clarity, UA clear    Glucose, UA neg    Bilirubin, UA neg    Ketones, UA neg    Spec Grav, UA 1.010    Blood, UA large    pH, UA 7.0    Protein, UA 1+    Urobilinogen, UA negative    Nitrite, UA neg    Leukocytes, UA small (1+) (A) Negative  POCT UA - Microscopic Only  Result Value Ref Range   WBC, Ur, HPF, POC 5-10    RBC, urine, microscopic occ    Bacteria, U Microscopic few    Mucus, UA negative    Epithelial cells, urine per micros few    Crystals, Ur, HPF, POC negative    Casts, Ur, LPF, POC negative    Yeast, UA negative   POCT CBC  Result Value Ref Range   WBC 5.0 4.6 - 10.2 K/uL   Lymph, poc 1.2 0.6 - 3.4   POC LYMPH PERCENT 23.4 10 - 50 %L   POC Granulocyte 3.3 2 - 6.9   Granulocyte percent 65.3 37 - 80 %G   RBC 4.89 4.04 - 5.48 M/uL   Hemoglobin 13.6 12.2 - 16.2 g/dL   HCT, POC 40.9 81.1 - 47.9 %   MCV 83.8 80 - 97 fL   MCH, POC 27.9 27 - 31.2 pg   MCHC 33.3 31.8 - 35.4 g/dL   RDW, POC 91.4 %   Platelet Count, POC 147 142 - 424 K/uL   MPV 9.9 0 - 99.8 fL  Assessment & Plan:  1. Body aches  - POCT Influenza A/B - POCT urinalysis dipstick - POCT UA - Microscopic Only - POCT CBC - Mononucleosis Test, Qual W/ Reflex  2. Sore throat  - POCT rapid strep A - Culture, Group A Strep - POCT CBC - Mononucleosis Test, Qual W/ Reflex  3. Malaise and fatigue  - POCT CBC - Mononucleosis Test, Qual W/ Reflex   Drink plenty of non-caffeinated fluids Rest Will report results of mono once labs return.   Latash Nouri A. Chauncey ReadingGann PA-C

## 2015-03-04 LAB — CULTURE, GROUP A STREP: Strep A Culture: NEGATIVE

## 2015-03-07 LAB — MONO QUAL W/RFLX QN: MONO QUAL W/RFLX QN: NEGATIVE

## 2015-03-08 ENCOUNTER — Other Ambulatory Visit: Payer: Self-pay | Admitting: Physician Assistant

## 2015-06-02 ENCOUNTER — Encounter: Payer: Self-pay | Admitting: Family Medicine

## 2015-06-02 ENCOUNTER — Ambulatory Visit (INDEPENDENT_AMBULATORY_CARE_PROVIDER_SITE_OTHER): Payer: PRIVATE HEALTH INSURANCE | Admitting: Family Medicine

## 2015-06-02 VITALS — BP 111/75 | HR 81 | Temp 98.1°F | Ht 66.0 in | Wt 170.0 lb

## 2015-06-02 DIAGNOSIS — Z91048 Other nonmedicinal substance allergy status: Secondary | ICD-10-CM | POA: Diagnosis not present

## 2015-06-02 DIAGNOSIS — L500 Allergic urticaria: Secondary | ICD-10-CM

## 2015-06-02 DIAGNOSIS — T7840XA Allergy, unspecified, initial encounter: Secondary | ICD-10-CM

## 2015-06-02 DIAGNOSIS — A630 Anogenital (venereal) warts: Secondary | ICD-10-CM | POA: Insufficient documentation

## 2015-06-02 MED ORDER — METHYLPREDNISOLONE ACETATE 40 MG/ML IJ SUSP
40.0000 mg | Freq: Once | INTRAMUSCULAR | Status: AC
Start: 2015-06-02 — End: 2015-06-02
  Administered 2015-06-02: 40 mg via INTRAMUSCULAR

## 2015-06-02 MED ORDER — PREDNISONE 20 MG PO TABS: ORAL_TABLET | ORAL | Status: DC

## 2015-06-02 NOTE — Progress Notes (Signed)
BP 111/75 mmHg  Pulse 81  Temp(Src) 98.1 F (36.7 C) (Oral)  Ht  (1.676 m)  Wt 170 lb (77.111 kg)  BMI 27.45 kg/m2   Subjective:    Patient ID: Sherry Bryant, female    DOB: Sep 15, 1990, 24 y.o.   MRN: [id number]  HPI: Sherry Bryant is a 24 y.o. female presenting on 06/02/2015 for Allergic Reaction   HPI Allergic reaction Patient presents today with a three-day history of allergic skin reaction on her face on her cheeks and around her eyelids on both sides worse on the left side. She even had a blister just above her left lip that burst open and is now healing. She thinks she had this allergic reaction to a new makeup that she tried. She stopped that makeup though in the allergic reaction has continued. She denies any fevers or chills.  Relevant past medical, surgical, family and social history reviewed and updated as indicated. Interim medical history since our last visit reviewed. Allergies and medications reviewed and updated.  Review of Systems  Constitutional: Negative for fever and chills.  HENT: Negative for congestion, ear discharge and ear pain.   Eyes: Negative for redness and visual disturbance.  Respiratory: Negative for chest tightness and shortness of breath.   Cardiovascular: Negative for chest pain and leg swelling.  Genitourinary: Negative for dysuria and difficulty urinating.  Musculoskeletal: Negative for back pain and gait problem.  Skin: Positive for rash.  Neurological: Negative for light-headedness and headaches.  Psychiatric/Behavioral: Negative for behavioral problems and agitation.  All other systems reviewed and are negative.   Per HPI unless specifically indicated above     Medication List       This list is accurate as of: 06/02/15 10:07 AM.  Always use your most recent med list.               medroxyPROGESTERone 150 MG/ML injection  Commonly known as:  DEPO-PROVERA  Inject 150 mg into the muscle every 3 (three) months.     predniSONE 20 MG tablet  Commonly known as:  DELTASONE  2 po at same time daily for 5 days           Objective:    BP 111/75 mmHg  Pulse 81  Temp(Src) 98.1 F (36.7 C) (Oral)  Ht  (1.676 m)  Wt 170 lb (77.111 kg)  BMI 27.45 kg/m2  Wt Readings from Last 3 Encounters:  06/02/15 170 lb (77.111 kg)  03/02/15 174 lb (78.926 kg)  12/30/14 176 lb (79.833 kg)    Physical Exam  Constitutional: She is oriented to person, place, and time. She appears well-developed and well-nourished. No distress.  Eyes: Conjunctivae and EOM are normal.  Cardiovascular: Normal rate, regular rhythm, normal heart sounds and intact distal pulses.   No murmur heard. Pulmonary/Chest: Effort normal and breath sounds normal. No respiratory distress. She has no wheezes.  Musculoskeletal: Normal range of motion. She exhibits no edema or tenderness.  Neurological: She is alert and oriented to person, place, and time. Coordination normal.  Skin: Skin is warm and dry. Rash noted. Rash is urticarial (patient has urticarial rashover bilateral cheeks under the eyes and a little bit around her eyes on her eyelids extending down to about the level of her mouth.. She has a healing small wound just above her left lip that she said was a blister before.). She is not diaphoretic.  Psychiatric: She has a normal mood and affect. Her behavior is normal.  Vitals reviewed.   Results for orders placed or performed in visit on 03/02/15  Culture, Group A Strep  Result Value Ref Range   Strep A Culture Negative   Mononucleosis Test, Qual W/ Reflex  Result Value Ref Range   Mono Qual W/Rflx Qn Negative Negative  POCT rapid strep A  Result Value Ref Range   Rapid Strep A Screen Negative Negative  POCT Influenza A/B  Result Value Ref Range   Influenza A, POC Negative Negative   Influenza B, POC Negative Negative  POCT urinalysis dipstick  Result Value Ref Range   Color, UA gold    Clarity, UA clear    Glucose, UA neg      Bilirubin, UA neg    Ketones, UA neg    Spec Grav, UA 1.010    Blood, UA large    pH, UA 7.0    Protein, UA 1+    Urobilinogen, UA negative    Nitrite, UA neg    Leukocytes, UA small (1+) (A) Negative  POCT UA - Microscopic Only  Result Value Ref Range   WBC, Ur, HPF, POC 5-10    RBC, urine, microscopic occ    Bacteria, U Microscopic few    Mucus, UA negative    Epithelial cells, urine per micros few    Crystals, Ur, HPF, POC negative    Casts, Ur, LPF, POC negative    Yeast, UA negative   POCT CBC  Result Value Ref Range   WBC 5.0 4.6 - 10.2 K/uL   Lymph, poc 1.2 0.6 - 3.4   POC LYMPH PERCENT 23.4 10 - 50 %L   POC Granulocyte 3.3 2 - 6.9   Granulocyte percent 65.3 37 - 80 %G   RBC 4.89 4.04 - 5.48 M/uL   Hemoglobin 13.6 12.2 - 16.2 g/dL   HCT, POC 16.1 09.6 - 47.9 %   MCV 83.8 80 - 97 fL   MCH, POC 27.9 27 - 31.2 pg   MCHC 33.3 31.8 - 35.4 g/dL   RDW, POC 04.5 %   Platelet Count, POC 147 142 - 424 K/uL   MPV 9.9 0 - 99.8 fL      Assessment & Plan:   Problem List Items Addressed This Visit    None    Visit Diagnoses    Allergic reaction, initial encounter    -  Primary    Relevant Medications    methylPREDNISolone acetate (DEPO-MEDROL) injection 40 mg    predniSONE (DELTASONE) 20 MG tablet        Follow up plan: Return in about 1 week (around 06/09/2015), or if symptoms worsen or fail to improve, for Genital wart treatment.  Arville Care, MD [facility] 06/02/2015, 10:07 AM

## 2015-10-14 ENCOUNTER — Encounter: Payer: Self-pay | Admitting: Family

## 2015-10-14 ENCOUNTER — Ambulatory Visit (INDEPENDENT_AMBULATORY_CARE_PROVIDER_SITE_OTHER): Payer: PRIVATE HEALTH INSURANCE | Admitting: Family

## 2015-10-14 VITALS — BP 113/72 | HR 84 | Temp 98.9°F | Ht 66.0 in | Wt 179.0 lb

## 2015-10-14 DIAGNOSIS — F411 Generalized anxiety disorder: Secondary | ICD-10-CM

## 2015-10-14 DIAGNOSIS — F329 Major depressive disorder, single episode, unspecified: Secondary | ICD-10-CM | POA: Diagnosis not present

## 2015-10-14 DIAGNOSIS — F32A Depression, unspecified: Secondary | ICD-10-CM

## 2015-10-14 MED ORDER — ESCITALOPRAM OXALATE 10 MG PO TABS: 10.0000 mg | ORAL_TABLET | Freq: Every day | ORAL | Status: AC

## 2015-10-14 NOTE — Progress Notes (Signed)
Subjective:    Patient ID: Sherry Bryant, female    DOB: Jun 15, 1991, 25 y.o.   MRN: [id number]  Pt presents to the office today to discuss depression and GAD. Pt states she has taken medication in the past, but can not remember what the names of the medications were.  Pt states she saw Behavioral Health in the past, but was put on on "two different medications". Pt states she was put on a med that she took TID that would "knock her out". PT states she just stopped all of her medications because she did not want to feel so "sleeply'. Pt states that over the last few weeks she has been very moody. Pt states she hurt her best friends feelings the other day, because she is so "moody". Pt states she needs to do something.  Depression      The patient presents with depression.  This is a recurrent problem.  The current episode started more than 1 year ago.   The onset quality is gradual.   The problem occurs intermittently.  Associated symptoms include irritable, restlessness, appetite change and sad.  Associated symptoms include no helplessness, no hopelessness, no myalgias, no headaches and no suicidal ideas.     The symptoms are aggravated by work stress.  Past treatments include nothing.  Compliance with treatment is good.  Past medical history includes anxiety and depression.   Anxiety Presents for follow-up visit. Onset was 1 to 5 years ago. The problem has been waxing and waning. Symptoms include depressed mood, excessive worry, irritability, nervous/anxious behavior and restlessness. Patient reports no nausea, palpitations, panic, shortness of breath or suicidal ideas. Symptoms occur most days. The severity of symptoms is moderate. The symptoms are aggravated by family issues.   Her past medical history is significant for anxiety/panic attacks and depression. Past treatments include nothing. The treatment provided no relief.      Review of Systems  Constitutional: Positive for appetite change  and irritability.  HENT: Negative.   Eyes: Negative.   Respiratory: Negative.  Negative for shortness of breath.   Cardiovascular: Negative.  Negative for palpitations.  Gastrointestinal: Negative.  Negative for nausea.  Endocrine: Negative.   Genitourinary: Negative.   Musculoskeletal: Negative.  Negative for myalgias.  Neurological: Negative.  Negative for headaches.  Hematological: Negative.   Psychiatric/Behavioral: Positive for depression. Negative for suicidal ideas. The patient is nervous/anxious.   All other systems reviewed and are negative.      Objective:   Physical Exam  Constitutional: She is oriented to person, place, and time. She appears well-developed and well-nourished. She is irritable. No distress.  HENT:  Head: Normocephalic and atraumatic.  Right Ear: External ear normal.  Left Ear: External ear normal.  Nose: Nose normal.  Mouth/Throat: Oropharynx is clear and moist.  Eyes: Pupils are equal, round, and reactive to light.  Neck: Normal range of motion. Neck supple. No thyromegaly present.  Cardiovascular: Normal rate, regular rhythm, normal heart sounds and intact distal pulses.   No murmur heard. Pulmonary/Chest: Effort normal and breath sounds normal. No respiratory distress. She has no wheezes.  Abdominal: Soft. Bowel sounds are normal. She exhibits no distension. There is no tenderness.  Musculoskeletal: Normal range of motion. She exhibits no edema or tenderness.  Neurological: She is alert and oriented to person, place, and time. She has normal reflexes. No cranial nerve deficit.  Skin: Skin is warm and dry.  Psychiatric: She has a normal mood and affect. Her behavior is normal.  Judgment and thought content normal.  Vitals reviewed.     BP 113/72 mmHg  Pulse 84  Temp(Src) 98.9 F (37.2 C) (Oral)  Ht  (1.676 m)  Wt 179 lb (81.194 kg)  BMI 28.91 kg/m2     Assessment & Plan:  1. Depression - escitalopram (LEXAPRO) 10 MG tablet; Take 1  tablet (10 mg total) by mouth daily.  Dispense: 90 tablet; Refill: 1  2. GAD (generalized anxiety disorder) - escitalopram (LEXAPRO) 10 MG tablet; Take 1 tablet (10 mg total) by mouth daily.  Dispense: 90 tablet; Refill: 1  Stress management discussed Pt started on Lexapro 10 mg daily RTO in 4 weeks to recheck GAD  Jannifer Rodney, FNP

## 2015-10-14 NOTE — Patient Instructions (Signed)
° °Generalized Anxiety Disorder °Generalized anxiety disorder (GAD) is a mental disorder. It interferes with life functions, including relationships, work, and school. °GAD is different from normal anxiety, which everyone experiences at some point in their lives in response to specific life events and activities. Normal anxiety actually helps us prepare for and get through these life events and activities. Normal anxiety goes away after the event or activity is over.  °GAD causes anxiety that is not necessarily related to specific events or activities. It also causes excess anxiety in proportion to specific events or activities. The anxiety associated with GAD is also difficult to control. GAD can vary from mild to severe. People with severe GAD can have intense waves of anxiety with physical symptoms (panic attacks).  °SYMPTOMS °The anxiety and worry associated with GAD are difficult to control. This anxiety and worry are related to many life events and activities and also occur more days than not for 6 months or longer. People with GAD also have three or more of the following symptoms (one or more in children): °· Restlessness.   °· Fatigue. °· Difficulty concentrating.   °· Irritability. °· Muscle tension. °· Difficulty sleeping or unsatisfying sleep. °DIAGNOSIS °GAD is diagnosed through an assessment by your health care provider. Your health care provider will ask you questions about your mood, physical symptoms, and events in your life. Your health care provider may ask you about your medical history and use of alcohol or drugs, including prescription medicines. Your health care provider may also do a physical exam and blood tests. Certain medical conditions and the use of certain substances can cause symptoms similar to those associated with GAD. Your health care provider may refer you to a mental health specialist for further evaluation. °TREATMENT °The following therapies are usually used to treat GAD:   °· Medication. Antidepressant medication usually is prescribed for long-term daily control. Antianxiety medicines may be added in severe cases, especially when panic attacks occur.   °· Talk therapy (psychotherapy). Certain types of talk therapy can be helpful in treating GAD by providing support, education, and guidance. A form of talk therapy called cognitive behavioral therapy can teach you healthy ways to think about and react to daily life events and activities. °· Stress management techniques. These include yoga, meditation, and exercise and can be very helpful when they are practiced regularly. °A mental health specialist can help determine which treatment is best for you. Some people see improvement with one therapy. However, other people require a combination of therapies. °  °This information is not intended to replace advice given to you by your health care provider. Make sure you discuss any questions you have with your health care provider. °  °Document Released: 12/08/2012 Document Revised: 09/03/2014 Document Reviewed: 12/08/2012 °[employer] Interactive Patient Education ©2016 [employer] ° °Major Depressive Disorder °Major depressive disorder is a mental illness. It also may be called clinical depression or unipolar depression. Major depressive disorder usually causes feelings of sadness, hopelessness, or helplessness. Some people with this disorder do not feel particularly sad but lose interest in doing things they used to enjoy (anhedonia). Major depressive disorder also can cause physical symptoms. It can interfere with work, school, relationships, and other normal everyday activities. The disorder varies in severity but is longer lasting and more serious than the sadness we all feel from time to time in our lives. °Major depressive disorder often is triggered by stressful life events or major life changes. Examples of these triggers include divorce, loss of your job or home,   a move, and the  death of a family member or close friend. Sometimes this disorder occurs for no obvious reason at all. People who have family members with major depressive disorder or bipolar disorder are at higher risk for developing this disorder, with or without life stressors. Major depressive disorder can occur at any age. It may occur just once in your life (single episode major depressive disorder). It may occur multiple times (recurrent major depressive disorder). °SYMPTOMS °People with major depressive disorder have either anhedonia or depressed mood on nearly a daily basis for at least 2 weeks or longer. Symptoms of depressed mood include: °· Feelings of sadness (blue or down in the dumps) or emptiness. °· Feelings of hopelessness or helplessness. °· Tearfulness or episodes of crying (may be observed by others). °· Irritability (children and adolescents). °In addition to depressed mood or anhedonia or both, people with this disorder have at least four of the following symptoms: °· Difficulty sleeping or sleeping too much.   °· Significant change (increase or decrease) in appetite or weight.   °· Lack of energy or motivation. °· Feelings of guilt and worthlessness.   °· Difficulty concentrating, remembering, or making decisions. °· Unusually slow movement (psychomotor retardation) or restlessness (as observed by others).   °· Recurrent wishes for death, recurrent thoughts of self-harm (suicide), or a suicide attempt. °People with major depressive disorder commonly have persistent negative thoughts about themselves, other people, and the world. People with severe major depressive disorder may experience distorted beliefs or perceptions about the world (psychotic delusions). They also may see or hear things that are not real (psychotic hallucinations). °DIAGNOSIS °Major depressive disorder is diagnosed through an assessment by your health care provider. Your health care provider will ask about aspects of your daily life,  such as mood, sleep, and appetite, to see if you have the diagnostic symptoms of major depressive disorder. Your health care provider may ask about your medical history and use of alcohol or drugs, including prescription medicines. Your health care provider also may do a physical exam and blood work. This is because certain medical conditions and the use of certain substances can cause major depressive disorder-like symptoms (secondary depression). Your health care provider also may refer you to a mental health specialist for further evaluation and treatment. °TREATMENT °It is important to recognize the symptoms of major depressive disorder and seek treatment. The following treatments can be prescribed for this disorder:   °· Medicine. Antidepressant medicines usually are prescribed. Antidepressant medicines are thought to correct chemical imbalances in the brain that are commonly associated with major depressive disorder. Other types of medicine may be added if the symptoms do not respond to antidepressant medicines alone or if psychotic delusions or hallucinations occur. °· Talk therapy. Talk therapy can be helpful in treating major depressive disorder by providing support, education, and guidance. Certain types of talk therapy also can help with negative thinking (cognitive behavioral therapy) and with relationship issues that trigger this disorder (interpersonal therapy). °A mental health specialist can help determine which treatment is best for you. Most people with major depressive disorder do well with a combination of medicine and talk therapy. Treatments involving electrical stimulation of the brain can be used in situations with extremely severe symptoms or when medicine and talk therapy do not work over time. These treatments include electroconvulsive therapy, transcranial magnetic stimulation, and vagal nerve stimulation. °  °This information is not intended to replace advice given to you by your health  care provider. Make sure you discuss any questions you have   with your health care provider. °  °Document Released: 12/08/2012 Document Revised: 09/03/2014 Document Reviewed: 12/08/2012 °[employer] Interactive Patient Education ©2016 [employer] ° °

## 2015-11-15 ENCOUNTER — Ambulatory Visit: Payer: PRIVATE HEALTH INSURANCE | Admitting: Family

## 2015-11-22 ENCOUNTER — Ambulatory Visit: Payer: PRIVATE HEALTH INSURANCE | Admitting: Family

## 2015-11-23 ENCOUNTER — Encounter: Payer: Self-pay | Admitting: Family Medicine
